# Patient Record
Sex: Male | Born: 1985 | Smoking: Current every day smoker
Health system: Southern US, Community
[De-identification: ages and names within clinical notes are randomized; demographics above are authoritative.]

---

## 2013-12-07 ENCOUNTER — Ambulatory Visit (INDEPENDENT_AMBULATORY_CARE_PROVIDER_SITE_OTHER): Payer: 59 | Admitting: Psychology

## 2013-12-07 DIAGNOSIS — F411 Generalized anxiety disorder: Secondary | ICD-10-CM

## 2013-12-21 ENCOUNTER — Ambulatory Visit: Payer: Self-pay | Admitting: Psychology

## 2014-01-04 ENCOUNTER — Ambulatory Visit: Payer: 59 | Admitting: Psychology

## 2014-07-12 ENCOUNTER — Encounter: Payer: Self-pay | Admitting: Internal Medicine

## 2014-09-03 ENCOUNTER — Ambulatory Visit: Payer: Self-pay | Admitting: Internal Medicine

## 2015-01-17 ENCOUNTER — Ambulatory Visit: Payer: Worker's Compensation

## 2015-01-17 ENCOUNTER — Ambulatory Visit (INDEPENDENT_AMBULATORY_CARE_PROVIDER_SITE_OTHER): Payer: Worker's Compensation | Admitting: Emergency Medicine

## 2015-01-17 VITALS — BP 138/98 | HR 67 | Temp 98.1°F | Resp 16 | Ht 71.0 in | Wt 220.0 lb

## 2015-01-17 DIAGNOSIS — M722 Plantar fascial fibromatosis: Secondary | ICD-10-CM

## 2015-01-17 MED ORDER — NAPROXEN SODIUM 550 MG PO TABS: 550.0000 mg | ORAL_TABLET | Freq: Two times a day (BID) | ORAL | Status: DC

## 2015-01-17 MED ORDER — HYDROCODONE-ACETAMINOPHEN 5-325 MG PO TABS: 1.0000 | ORAL_TABLET | ORAL | Status: DC | PRN

## 2015-01-17 NOTE — Patient Instructions (Signed)
Foot Sprain The muscles and cord like structures which attach muscle to bone (tendons) that surround the feet are made up of units. A foot sprain can occur at the weakest spot in any of these units. This condition is most often caused by injury to or overuse of the foot, as from playing contact sports, or aggravating a previous injury, or from poor conditioning, or obesity. SYMPTOMS  Pain with movement of the foot.  Tenderness and swelling at the injury site.  Loss of strength is present in moderate or severe sprains. THE THREE GRADES OR SEVERITY OF FOOT SPRAIN ARE:  Mild (Grade I): Slightly pulled muscle without tearing of muscle or tendon fibers or loss of strength.  Moderate (Grade II): Tearing of fibers in a muscle, tendon, or at the attachment to bone, with small decrease in strength.  Severe (Grade III): Rupture of the muscle-tendon-bone attachment, with separation of fibers. Severe sprain requires surgical repair. Often repeating (chronic) sprains are caused by overuse. Sudden (acute) sprains are caused by direct injury or over-use. DIAGNOSIS  Diagnosis of this condition is usually by your own observation. If problems continue, a caregiver may be required for further evaluation and treatment. X-rays may be required to make sure there are not breaks in the bones (fractures) present. Continued problems may require physical therapy for treatment. PREVENTION  Use strength and conditioning exercises appropriate for your sport.  Warm up properly prior to working out.  Use athletic shoes that are made for the sport you are participating in.  Allow adequate time for healing. Early return to activities makes repeat injury more likely, and can lead to an unstable arthritic foot that can result in prolonged disability. Mild sprains generally heal in 3 to 10 days, with moderate and severe sprains taking 2 to 10 weeks. Your caregiver can help you determine the proper time required for  healing. HOME CARE INSTRUCTIONS   Apply ice to the injury for 15-20 minutes, 03-04 times per day. Put the ice in a plastic bag and place a towel between the bag of ice and your skin.  An elastic wrap (like an Ace bandage) may be used to keep swelling down.  Keep foot above the level of the heart, or at least raised on a footstool, when swelling and pain are present.  Try to avoid use other than gentle range of motion while the foot is painful. Do not resume use until instructed by your caregiver. Then begin use gradually, not increasing use to the point of pain. If pain does develop, decrease use and continue the above measures, gradually increasing activities that do not cause discomfort, until you gradually achieve normal use.  Use crutches if and as instructed, and for the length of time instructed.  Keep injured foot and ankle wrapped between treatments.  Massage foot and ankle for comfort and to keep swelling down. Massage from the toes up towards the knee.  Only take over-the-counter or prescription medicines for pain, discomfort, or fever as directed by your caregiver. SEEK IMMEDIATE MEDICAL CARE IF:   Your pain and swelling increase, or pain is not controlled with medications.  You have loss of feeling in your foot or your foot turns cold or blue.  You develop new, unexplained symptoms, or an increase of the symptoms that brought you to your caregiver. MAKE SURE YOU:   Understand these instructions.  Will watch your condition.  Will get help right away if you are not doing well or get worse. Document Released:   01/08/2002 Document Revised: 10/11/2011 Document Reviewed: 03/07/2008 ExitCare Patient Information 2015 [employer]. This information is not intended to replace advice given to you by your health care provider. Make sure you discuss any questions you have with your health care provider.  

## 2015-01-17 NOTE — Progress Notes (Signed)
Subjective:  Patient ID: Allen Ochoa, male    DOB: 1986-01-09  Age: 29 y.o. MRN: [id number]  CC: Foot Injury   HPI Allen Ochoa presents   With an injury to his left foot. This working on a ladder and was stung repeatedly by a number of bees. He descended halfway down the ladder and about a 6 step he jumped and landed on his left foot. He has marked pain in his left arch of his foot with no swelling ecchymosis or deformity is unable to bear weight without significant pain. He has no improvement of the discomfort with Modic. He's not had any issues related to the insects stings just painful.  History Allen Ochoa has no past medical history on file.   He has no past surgical history on file.   His  family history includes Cancer in his maternal grandfather.  He   reports that he has been smoking.  He does not have any smokeless tobacco history on file. He reports that he drinks about 1.2 oz of alcohol per week. He reports that he does not use illicit drugs.  No outpatient prescriptions prior to visit.   No facility-administered medications prior to visit.    History   Social History  . Marital Status: Unknown    Spouse Name: N/A  . Number of Children: N/A  . Years of Education: N/A   Social History Main Topics  . Smoking status: Current Every Day Smoker  . Smokeless tobacco: Not on file  . Alcohol Use: 1.2 oz/week    2 Standard drinks or equivalent per week  . Drug Use: No  . Sexual Activity: Not on file   Other Topics Concern  . None   Social History Narrative  . None     Review of Systems  Constitutional: Negative for fever, chills and appetite change.  HENT: Negative for congestion, ear pain, postnasal drip, sinus pressure and sore throat.   Eyes: Negative for pain and redness.  Respiratory: Negative for cough, shortness of breath and wheezing.   Cardiovascular: Negative for leg swelling.  Gastrointestinal: Negative for nausea, vomiting, abdominal  pain, diarrhea, constipation and blood in stool.  Endocrine: Negative for polyuria.  Genitourinary: Negative for dysuria, urgency, frequency and flank pain.  Musculoskeletal: Negative for gait problem.  Skin: Negative for rash.  Neurological: Negative for weakness and headaches.  Psychiatric/Behavioral: Negative for confusion and decreased concentration. The patient is not nervous/anxious.     Objective:  BP 138/98 mmHg  Pulse 67  Temp(Src) 98.1 F (36.7 C) (Oral)  Resp 16  Ht  (1.803 m)  Wt 220 lb (99.791 kg)  BMI 30.70 kg/m2  SpO2 98%  Physical Exam  Musculoskeletal:       Left foot: There is tenderness. There is no swelling and no deformity.      Assessment & Plan:   Allen Ochoa was seen today for foot injury.  Diagnoses and all orders for this visit:  Plantar fasciitis of left foot Orders: -     Ambulatory referral to Orthopedic Surgery -     DG Foot Complete Left; Future  Other orders -     naproxen sodium (ANAPROX DS) 550 MG tablet; Take 1 tablet (550 mg total) by mouth 2 (two) times daily with a meal. -     HYDROcodone-acetaminophen (NORCO) 5-325 MG per tablet; Take 1-2 tablets by mouth every 4 (four) hours as needed.   I am having Allen Ochoa start on naproxen sodium and HYDROcodone-acetaminophen. I  am also having him maintain his meloxicam.  Meds ordered this encounter  Medications  . meloxicam (MOBIC) 15 MG tablet    Sig: Take 15 mg by mouth daily.  . naproxen sodium (ANAPROX DS) 550 MG tablet    Sig: Take 1 tablet (550 mg total) by mouth 2 (two) times daily with a meal.    Dispense:  40 tablet    Refill:  0  . HYDROcodone-acetaminophen (NORCO) 5-325 MG per tablet    Sig: Take 1-2 tablets by mouth every 4 (four) hours as needed.    Dispense:  30 tablet    Refill:  0    Appropriate red flag conditions were discussed with the patient as well as actions that should be taken.  Patient expressed his understanding.  Follow-up: No Follow-up on  file.  Carmelina Dane, MD   [facility] reading (PRIMARY) by  Dr. Dareen Piano.  negative.

## 2015-01-24 ENCOUNTER — Ambulatory Visit (INDEPENDENT_AMBULATORY_CARE_PROVIDER_SITE_OTHER): Payer: Worker's Compensation | Admitting: Emergency Medicine

## 2015-01-24 VITALS — BP 120/68 | HR 71 | Temp 98.4°F | Resp 17 | Ht 72.0 in | Wt 215.0 lb

## 2015-01-24 DIAGNOSIS — M722 Plantar fascial fibromatosis: Secondary | ICD-10-CM | POA: Diagnosis not present

## 2015-01-24 NOTE — Patient Instructions (Signed)
Plantar Fasciitis  Plantar fasciitis is a common condition that causes foot pain. It is soreness (inflammation) of the band of tough fibrous tissue on the bottom of the foot that runs from the heel bone (calcaneus) to the ball of the foot. The cause of this soreness may be from excessive standing, poor fitting shoes, running on hard surfaces, being overweight, having an abnormal walk, or overuse (this is common in runners) of the painful foot or feet. It is also common in aerobic exercise dancers and ballet dancers.  SYMPTOMS   Most people with plantar fasciitis complain of:   Severe pain in the morning on the bottom of their foot especially when taking the first steps out of bed. This pain recedes after a few minutes of walking.   Severe pain is experienced also during walking following a long period of inactivity.   Pain is worse when walking barefoot or up stairs  DIAGNOSIS    Your caregiver will diagnose this condition by examining and feeling your foot.   Special tests such as X-rays of your foot, are usually not needed.  PREVENTION    Consult a sports medicine professional before beginning a new exercise program.   Walking programs offer a good workout. With walking there is a lower chance of overuse injuries common to runners. There is less impact and less jarring of the joints.   Begin all new exercise programs slowly. If problems or pain develop, decrease the amount of time or distance until you are at a comfortable level.   Wear good shoes and replace them regularly.   Stretch your foot and the heel cords at the back of the ankle (Achilles tendon) both before and after exercise.   Run or exercise on even surfaces that are not hard. For example, asphalt is better than pavement.   Do not run barefoot on hard surfaces.   If using a treadmill, vary the incline.   Do not continue to workout if you have foot or joint problems. Seek professional help if they do not improve.  HOME CARE INSTRUCTIONS     Avoid activities that cause you pain until you recover.   Use ice or cold packs on the problem or painful areas after working out.   Only take over-the-counter or prescription medicines for pain, discomfort, or fever as directed by your caregiver.   Soft shoe inserts or athletic shoes with air or gel sole cushions may be helpful.   If problems continue or become more severe, consult a sports medicine caregiver or your own health care provider. Cortisone is a potent anti-inflammatory medication that may be injected into the painful area. You can discuss this treatment with your caregiver.  MAKE SURE YOU:    Understand these instructions.   Will watch your condition.   Will get help right away if you are not doing well or get worse.  Document Released: 04/13/2001 Document Revised: 10/11/2011 Document Reviewed: 06/12/2008  ExitCare Patient Information 2015 [employer]. This information is not intended to replace advice given to you by your health care provider. Make sure you discuss any questions you have with your health care provider.

## 2015-01-24 NOTE — Progress Notes (Signed)
Allen Ochoa 1985/11/26 29 y.o.   Chief Complaint  Patient presents with  . Follow-up    Left foot     Date of Injury: 01/17/15  History of Present Illness:  Presents for evaluation of work-related complaint.   Review of Systems  Constitutional: Negative.   Respiratory: Negative.   Skin: Negative for rash.  Neurological: Negative.     Review of systems was unremarkable   No Known Allergies   Current medications reviewed and updated. Past medical history, family history, social history have been reviewed and updated.   Physical Exam  Constitutional: He is oriented to person, place, and time. Vital signs are normal. He appears healthy. He appears distressed.  HENT:  Head: Normocephalic and atraumatic.  Eyes: Pupils are equal, round, and reactive to light.  Neck: Normal range of motion. Neck supple.  Pulmonary/Chest: Effort normal. No respiratory distress.  Abdominal: Soft.  Musculoskeletal:       Left foot: There is tenderness. There is no swelling and no crepitus.  Neurological: He is alert and oriented to person, place, and time.  Skin: Skin is warm and dry.     Assessment and Plan:   Plantar fasciitis.  He has an appointment with orthopedic surgeon next Thursday. He is to continue his medication both a Anaprox Norco as needed using a a boot for ambulation. Continue work restrictions with the understanding if he is able to return to working On Monday He May Do so.Marland Kitchen

## 2015-08-22 ENCOUNTER — Ambulatory Visit (INDEPENDENT_AMBULATORY_CARE_PROVIDER_SITE_OTHER): Payer: Worker's Compensation | Admitting: Family Medicine

## 2015-08-22 ENCOUNTER — Ambulatory Visit: Payer: Worker's Compensation

## 2015-08-22 VITALS — BP 122/74 | HR 87 | Temp 98.3°F | Resp 17 | Ht 71.5 in | Wt 212.0 lb

## 2015-08-22 DIAGNOSIS — S99922A Unspecified injury of left foot, initial encounter: Secondary | ICD-10-CM | POA: Diagnosis not present

## 2015-08-22 MED ORDER — NAPROXEN 500 MG PO TABS: 500.0000 mg | ORAL_TABLET | Freq: Two times a day (BID) | ORAL | Status: AC

## 2015-08-22 NOTE — Patient Instructions (Signed)
Ice for 20-30 minutes every 2-3 hours, elevated and off.  Use crutches until you can walk normally without limping.  Recommend wearing the boot until you can use shoes without difficulty.  Elevate and off it over the weekend.  Hopefully on Monday you can go back to work wearing the boot. Recheck on Monday with the boot.  SYMPTOMS   General pain on the bottom of the heel.  Pain that gets worse when running on hard surfaces or in a shoe with poor shock absorbers.  No swelling or increased warmth.  Less cushion on bottom of the heel.   PREVENTION   Warm up and stretch properly before activity.  Maintain physical fitness:  Strength, flexibility and endurance.  Cardiovascular fitness.  Maintain a healthy body weight.  Avoid activities that place constant or repeated strain on the foot.  Wear properly fitted and padded shoes.  Change shoes every 300 to 500 miles.  When possible, run on soft surfaces.  Wear a heel lift to reduce pressure to the heel (pushing weight to the front of the foot).  Emphasize cross training. PROGNOSIS  If treated properly, heel compression syndrome may be cured. However, sometimes heel compression syndrome results in a chronic condition. RELATED COMPLICATIONS  Frequently recurring symptoms, resulting in a chronic problem that often affects your ability to compete.  TREATMENT  Treatment first involves ice and medicine to reduce pain and inflammation. It is important to perform exercises that stretch the heel cord, and to modify activities that aggravate symptoms. These exercises may be performed at home or with a therapist. You may find changing shoes or using a heel insert helpful in reducing pain and discomfort. There are no surgical procedures that exist to treat this condition.  MEDICATION  If pain medicine is needed, nonsteroidal anti-inflammatory medicines (aspirin and ibuprofen), or other minor pain relievers (acetaminophen), are often  advised  Prescription pain relievers may be given if your caregiver thinks they are needed. Use only as directed and only as much as you need. HEAT AND COLD  Cold treatment (icing) relieves pain and reduces inflammation. Cold treatment should be applied for 10 to 15 minutes every 2 to 3 hours, and immediately after activity that aggravates your symptoms. Use ice packs or an ice massage.  Heat treatment may be used before performing stretching and strengthening activities prescribed by your caregiver, physical therapist, or athletic trainer. Use a heat pack or a warm water soak. SEEK MEDICAL CARE IF:  Symptoms get worse or do not improve in 2 weeks, despite treatment. EXERCISES  STRETCHING EXERCISES - Heel Compression Syndrome (Fat Pad Atrophy) These exercises may help you when beginning to rehabilitate your injury. Your symptoms may go away with or without further involvement from your physician, physical therapist or athletic trainer. While completing these exercises, remember:   Restoring tissue flexibility helps normal motion to return to the joints. This allows healthier, less painful movement and activity.  An effective stretch should be held for at least 30 seconds.  A stretch should never be painful. You should only feel a gentle lengthening or release in the stretched tissue. STRETCH - Gastroc, Standing   Place your hands on a wall.  Extend your right / left leg behind you, keeping the front knee somewhat bent.  Slightly point your toes inward on your back foot.  Keeping your right / left heel on the floor and your knee straight, shift your weight toward the wall, not allowing your back to arch.  You  should feel a gentle stretch in the right / left calf. Hold this position for __________ seconds. Repeat __________ times. Complete this stretch __________ times per day. STRETCH - Soleus, Standing   Place your hands on a wall.  Extend your right / left leg behind you, keeping  the other knee somewhat bent.  Slightly point your toes inward on your back foot.  Keep your right / left heel on the floor, bend your back knee, and slightly shift your weight over the back leg, so that you feel a gentle stretch deep in your back calf.  Hold this position for __________ seconds. Repeat __________ times. Complete this stretch __________ times per day. STRETCH - Gastrocsoleus, Standing  Note: This exercise can place a lot of stress on your foot and ankle. Please complete this exercise only if specifically instructed by your caregiver.  Place the ball of your right / left foot on a step, keeping your other foot firmly on the same step.  Hold on to the wall or a rail for balance.  Slowly lift your other foot, allowing your body weight to press your heel down over the edge of the step.  You should feel a stretch in your right / left calf.  Hold this position for __________ seconds.  Repeat this exercise with a slight bend in your misc216 knee. Repeat __________ times. Complete this stretch __________ times per day.    This information is not intended to replace advice given to you by your health care provider. Make sure you discuss any questions you have with your health care provider.   Document Released: 07/19/2005 Document Revised: 08/09/2014 Document Reviewed: 10/31/2008 Elsevier Interactive Patient Education [employer]

## 2015-08-22 NOTE — Progress Notes (Signed)
Subjective:  By signing my name below, I, Rawaa Al Rifaie, attest that this documentation has been prepared under the direction and in the presence of Norberto Sorenson, MD.  Allen Ochoa, Medical Scribe. 08/22/2015.  1:52 PM.    Patient ID: Allen Ochoa, male    DOB: 04/03/1986, 30 y.o.   MRN: [id number]  Chief Complaint  Patient presents with  . Ankle Injury    left side   . Depression    HPI HPI Comments: Allen Ochoa is a 30 y.o. male who presents to [facility] complaining of a left ankle injury that occurred this morning. He notes that as he was on a small hill carrying a ladder when he slipped and fell. Pt reports the pain to be over his heel area. He indicates that he has a history of plantar fasciitis that he usually takes tramadol for, which he took for his current symptoms. Pt also took Ibuprofen. He notes that he already has crutches and short boots at home from his last fall in June.    Prior to Admission medications   Medication Sig Start Date End Date Taking? Authorizing Provider  traMADol (ULTRAM) 50 MG tablet Take by mouth every 6 (six) hours as needed.   Yes Historical Provider, MD   No Known Allergies   Review of Systems  Constitutional: Positive for activity change. Negative for fever and appetite change.  Musculoskeletal: Positive for arthralgias and gait problem. Negative for myalgias, back pain and joint swelling.  Skin: Negative for color change, rash and wound.  Neurological: Negative for weakness and numbness.  Hematological: Does not bruise/bleed easily.  Psychiatric/Behavioral: Negative for sleep disturbance.      Objective:   Physical Exam  Constitutional: He is oriented to person, place, and time. He appears well-developed and well-nourished. No distress.  HENT:  Head: Normocephalic and atraumatic.  Eyes: EOM are normal. Pupils are equal, round, and reactive to light.  Neck: Neck supple.  Cardiovascular: Normal rate.     Pulses:      Dorsalis pedis pulses are 2+ on the right side, and 2+ on the left side.       Posterior tibial pulses are 2+ on the right side, and 2+ on the left side.  Pulmonary/Chest: Effort normal.  Musculoskeletal:  No tenderness over the medial later malleolus, or cf ligament. No significant tenderness over the metatarsal. Moderate restriction and ROM. Achillis intact. Negative squeeze test.   Neurological: He is alert and oriented to person, place, and time. No cranial nerve deficit.  Skin: Skin is warm and dry.  Psychiatric: He has a normal mood and affect. His behavior is normal.  Nursing note and vitals reviewed.   BP 122/74 mmHg  Pulse 87  Temp(Src) 98.3 F (36.8 C) (Oral)  Resp 17  Ht 5' 11.5" (1.816 m)  Wt 212 lb (96.163 kg)  BMI 29.16 kg/m2  SpO2 98%   [facility] (PRIMARY) x-ray report read by Dr. Norberto Sorenson, MD: left foot- normal.      Assessment & Plan:   1. Foot injury, left, initial encounter   RICE - use crutches until can walk w/o limp. Cont boot. Recheck in 3d.  Orders Placed This Encounter  Procedures  . DG Foot Complete Left    Standing Status: Future     Number of Occurrences: 1     Standing Expiration Date: 08/21/2016    Order Specific Question:  Reason for Exam (SYMPTOM  OR DIAGNOSIS REQUIRED)  Answer:  FELL OF LADDER. PAIN OVER HEAL    Order Specific Question:  Preferred imaging location?    Answer:  External    Meds ordered this encounter  Medications  . traMADol (ULTRAM) 50 MG tablet    Sig: Take by mouth every 6 (six) hours as needed.  . naproxen (NAPROSYN) 500 MG tablet    Sig: Take 1 tablet (500 mg total) by mouth 2 (two) times daily with a meal.    Dispense:  30 tablet    Refill:  0    I personally performed the services described in this documentation, which was scribed in my presence. The recorded information has been reviewed and considered, and addended by me as needed.  Norberto Sorenson, MD MPH

## 2015-12-19 IMAGING — CR DG FOOT COMPLETE 3+V*L*
3 series · 3 of 3 positions shown · non-contrast
Comparison: None.

CLINICAL DATA: Foot pain.

EXAM:
LEFT FOOT - COMPLETE 3+ VIEW

[AP]
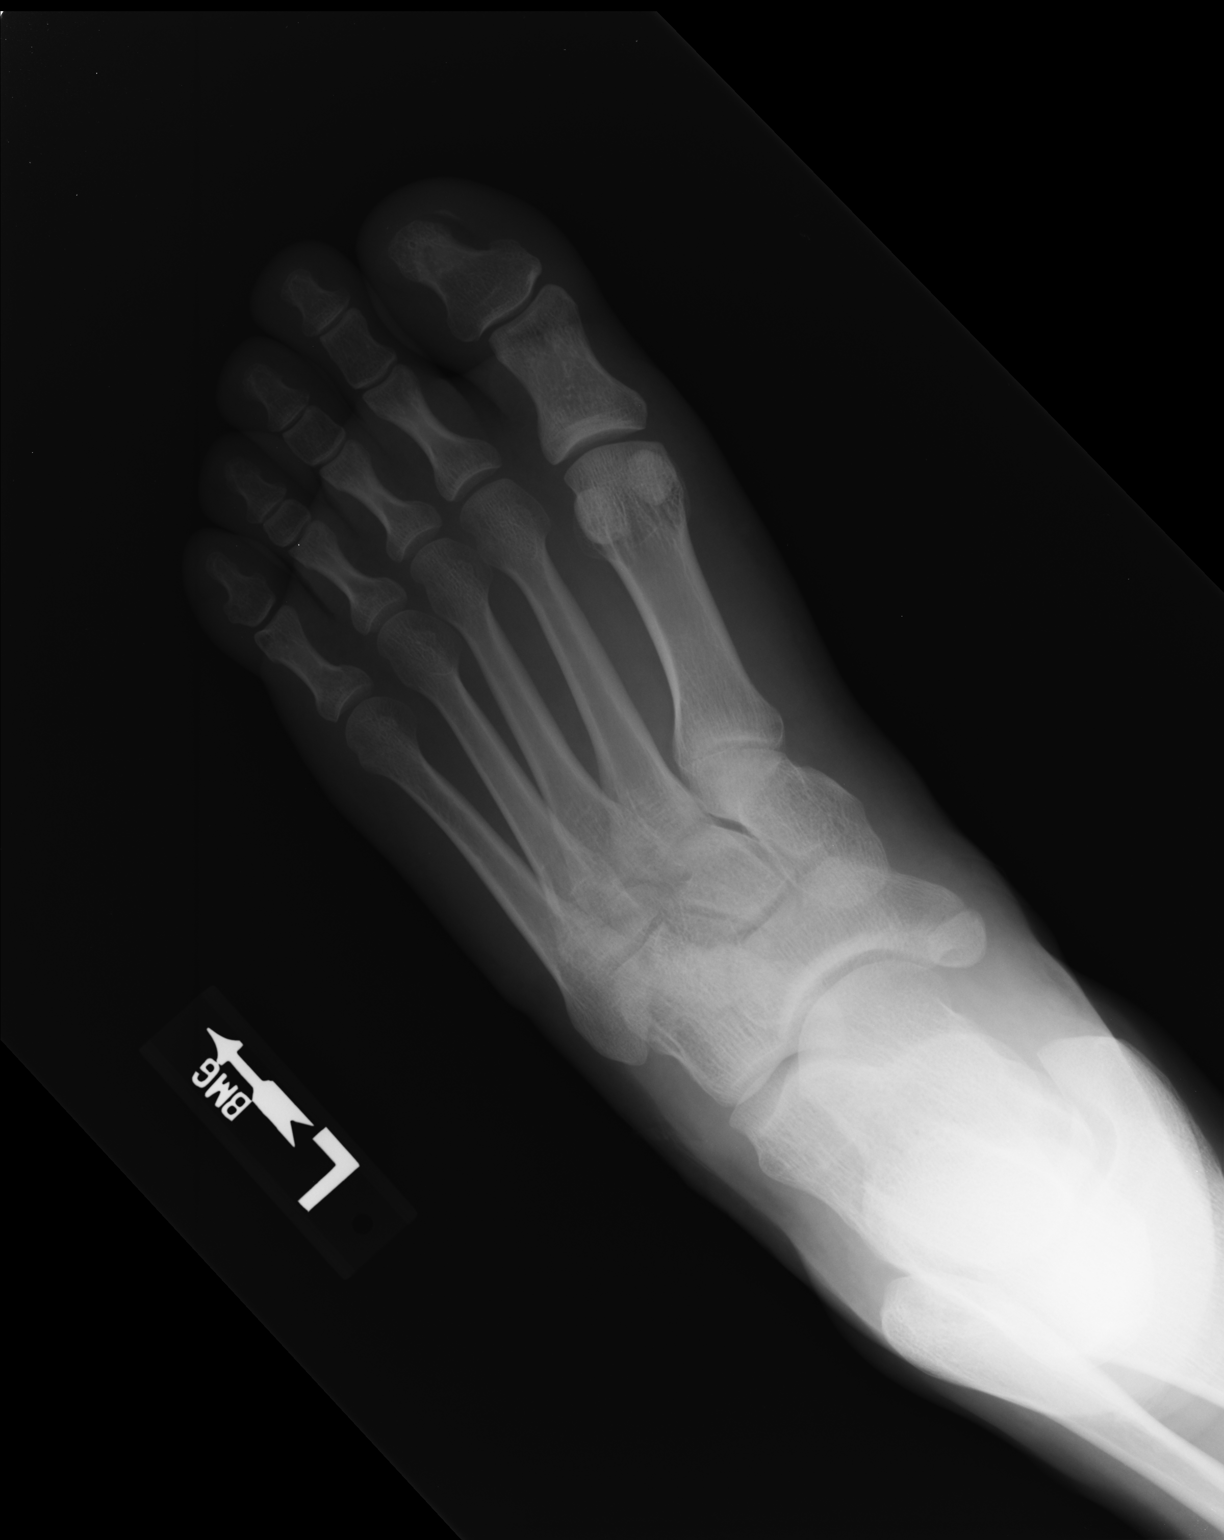

[ap obl int rot]
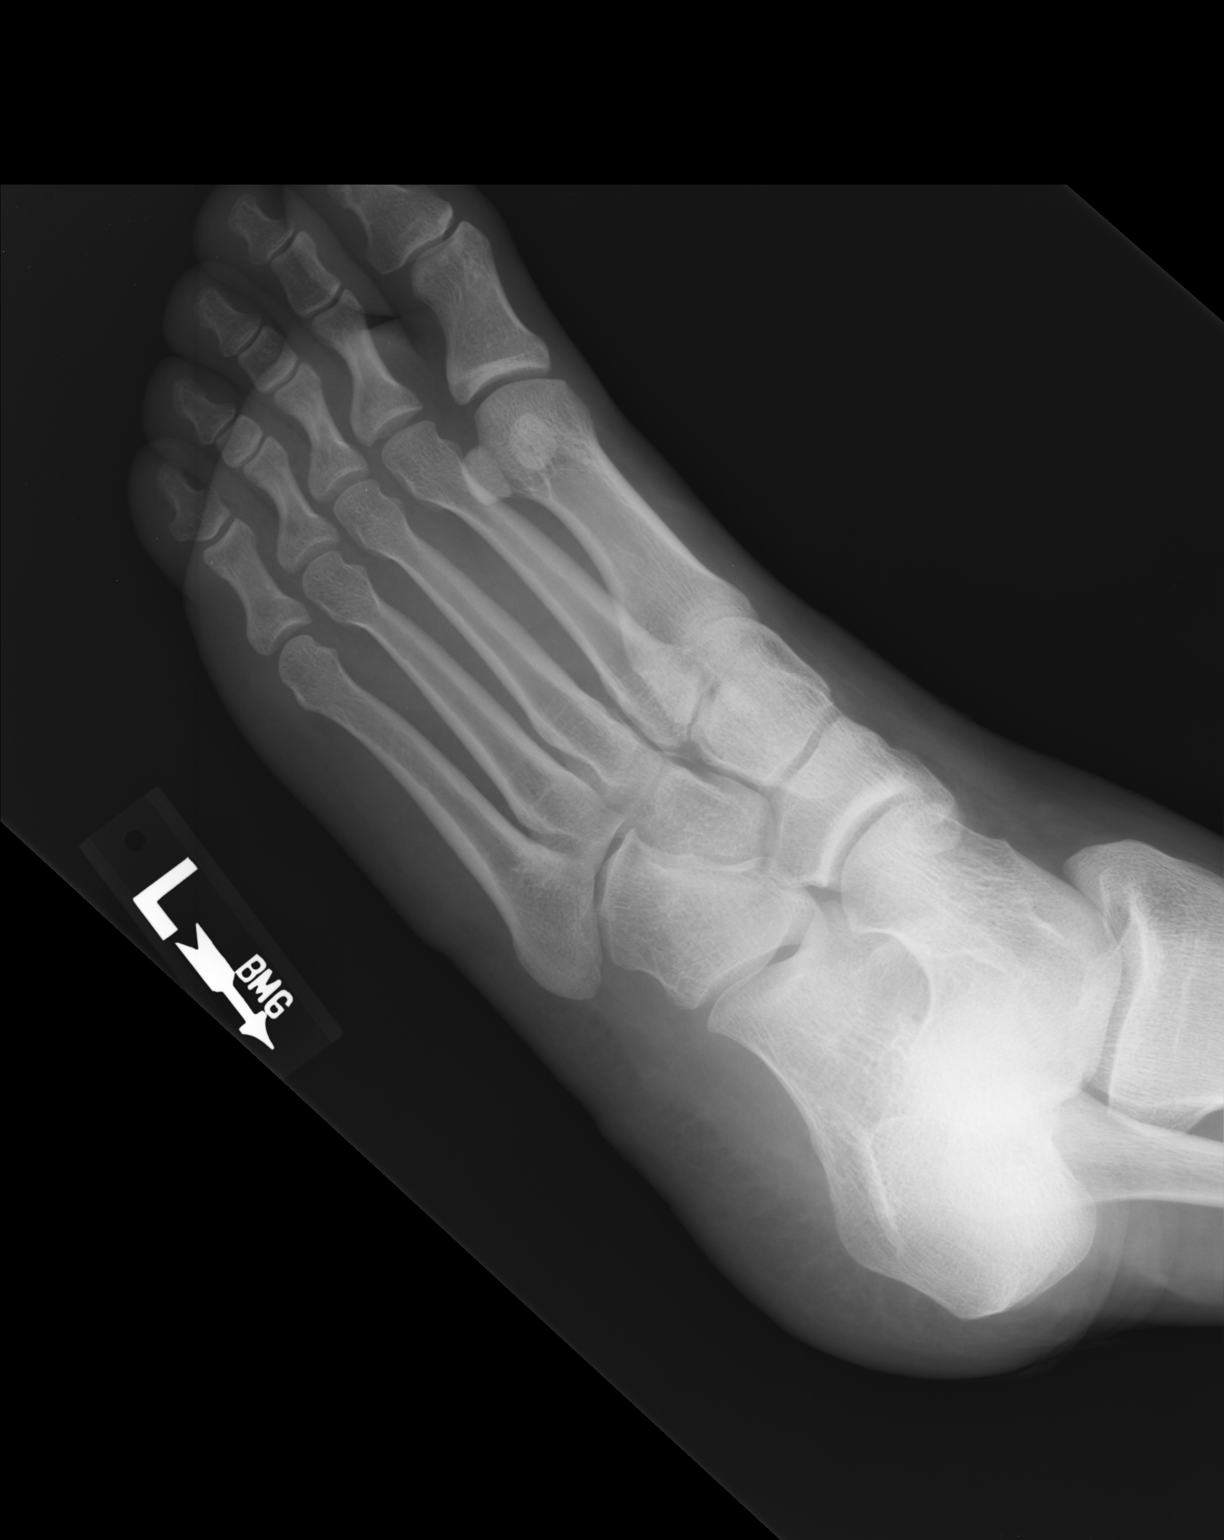

[lateral]
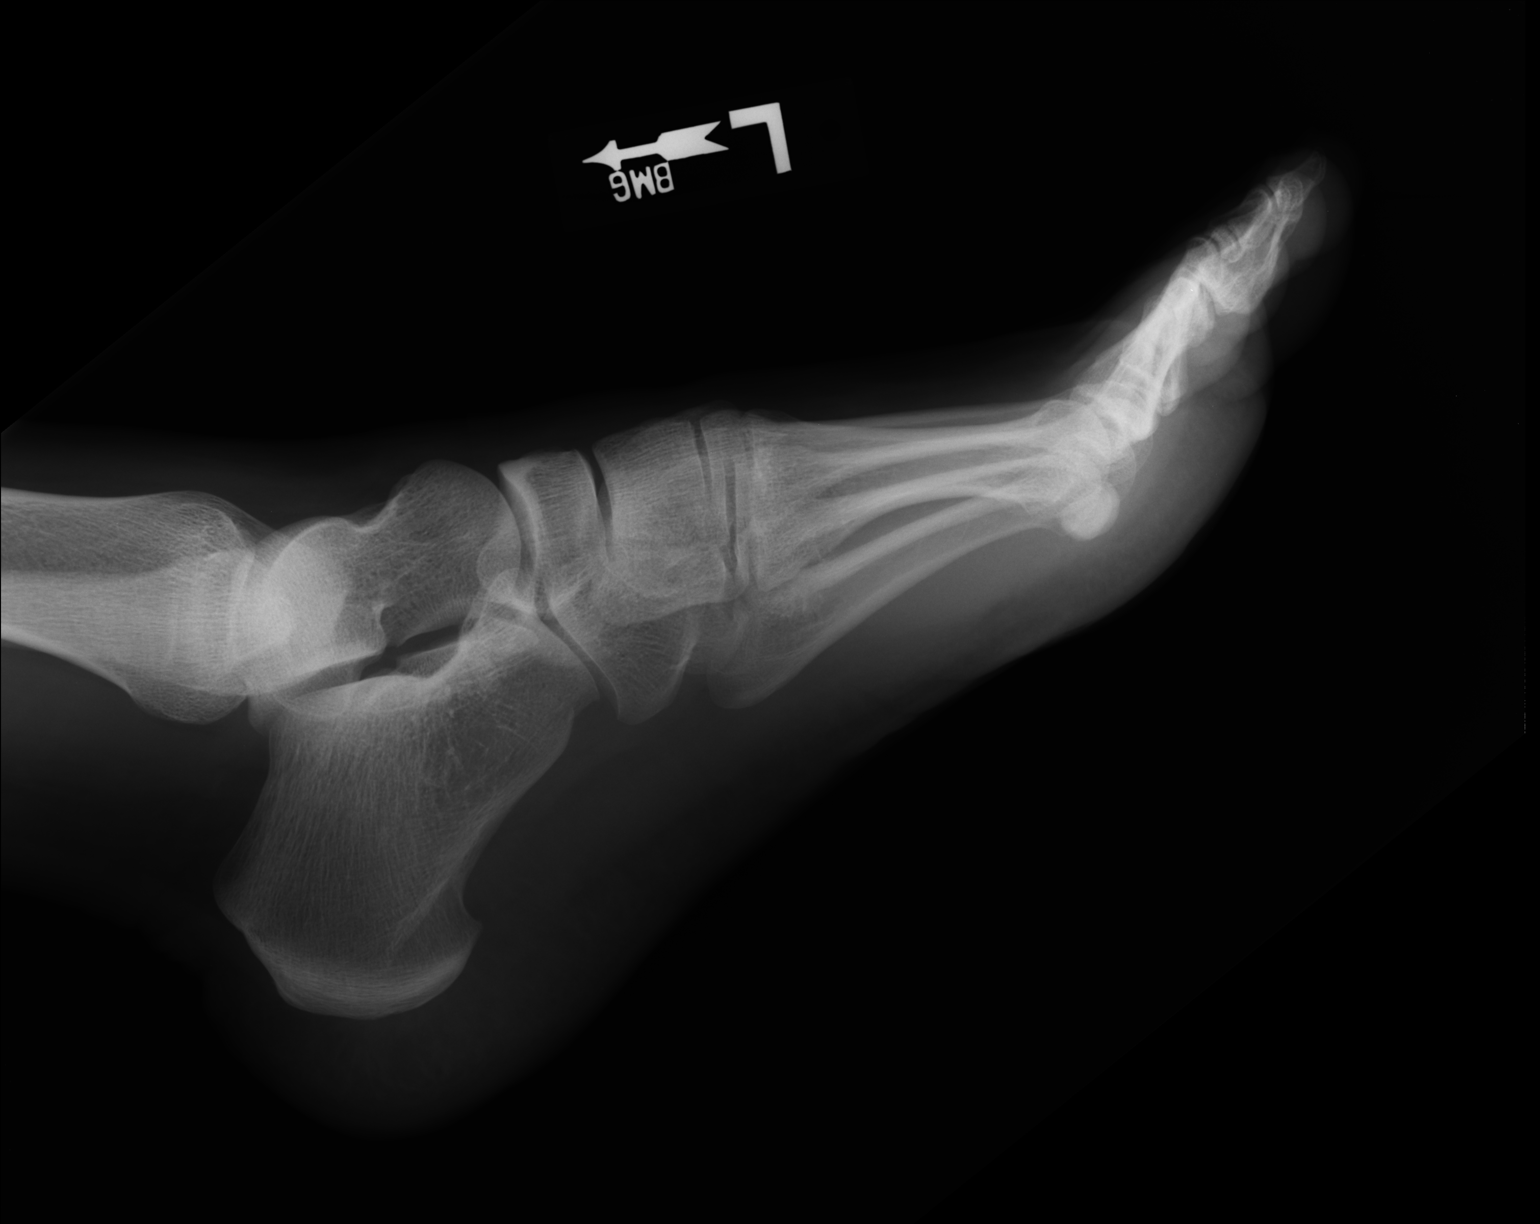

[3 of 3 positions shown; findings below may reference images not displayed]

FINDINGS: No acute bony or joint abnormality identified. No evidence of
fracture or dislocation. No acute abnormality identified.
IMPRESSION: No acute abnormality.

## 2016-07-23 IMAGING — CR DG FOOT COMPLETE 3+V*L*
3 series · 3 of 3 positions shown · non-contrast
Comparison: 01/17/2015

CLINICAL DATA: Fall from ladder with pain over the left heel.
Initial encounter.

EXAM:
LEFT FOOT - COMPLETE 3+ VIEW

[AP]
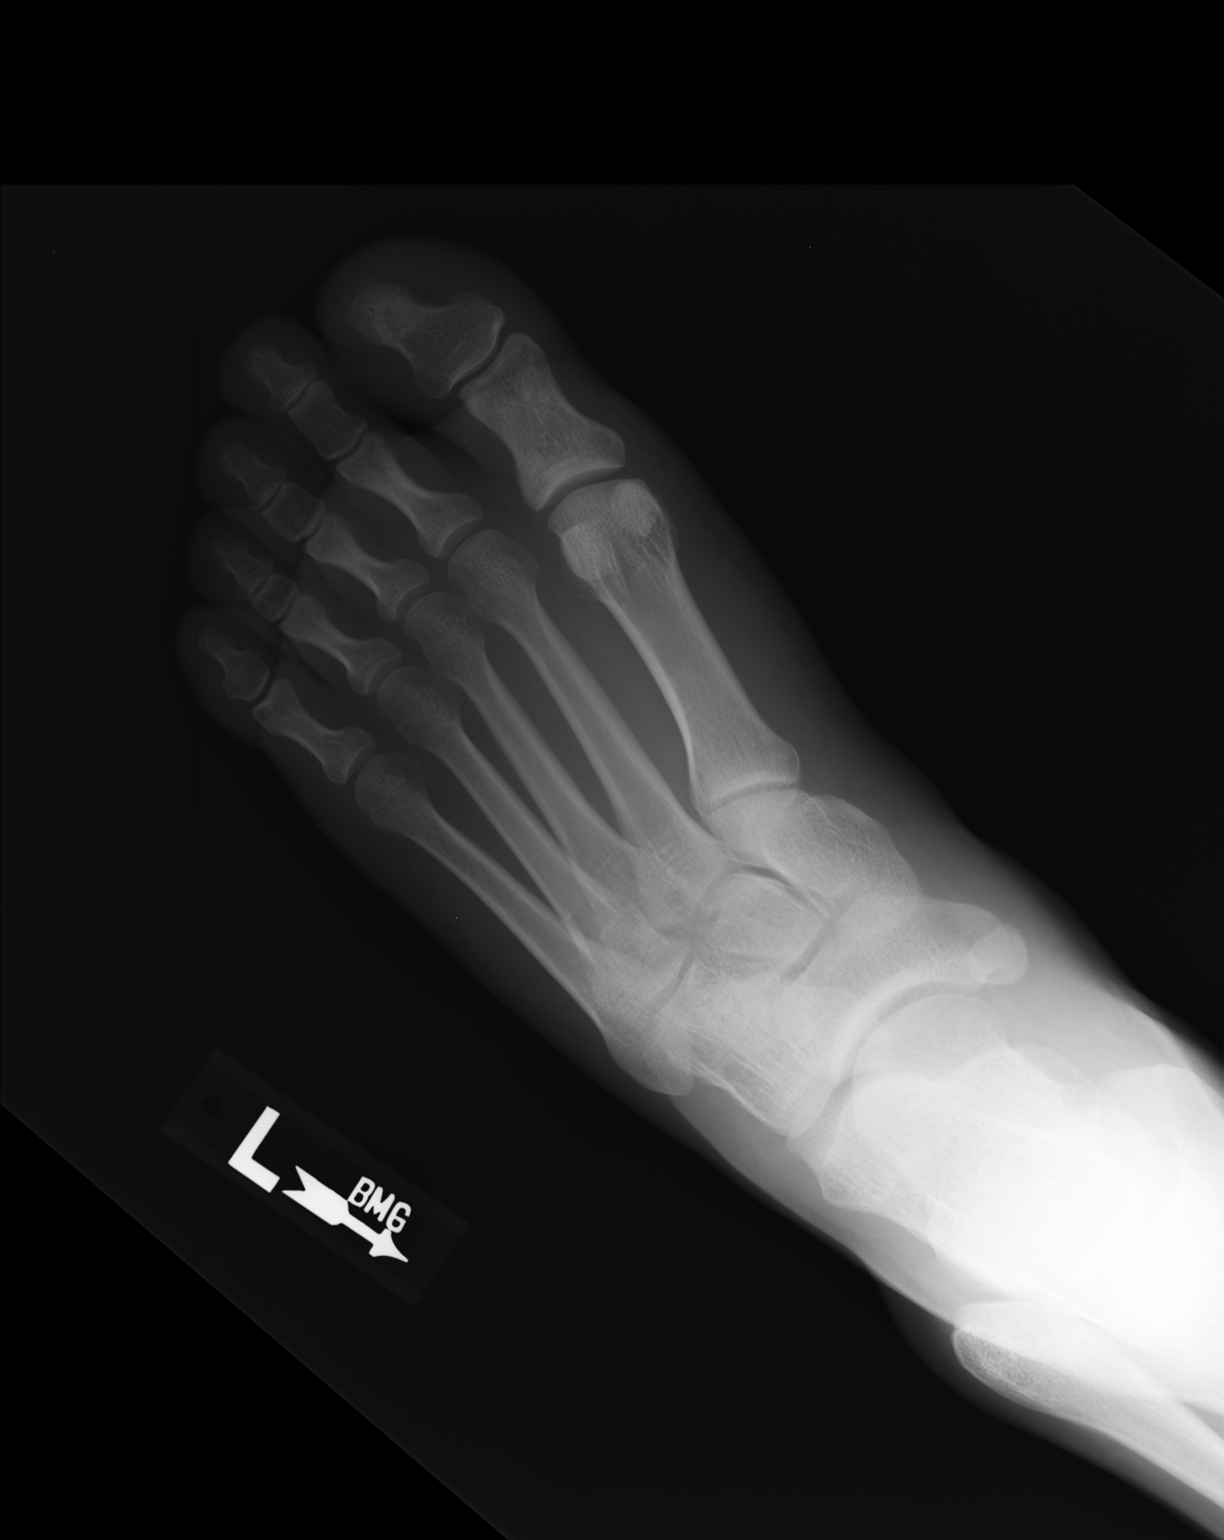

[ap obl int rot]
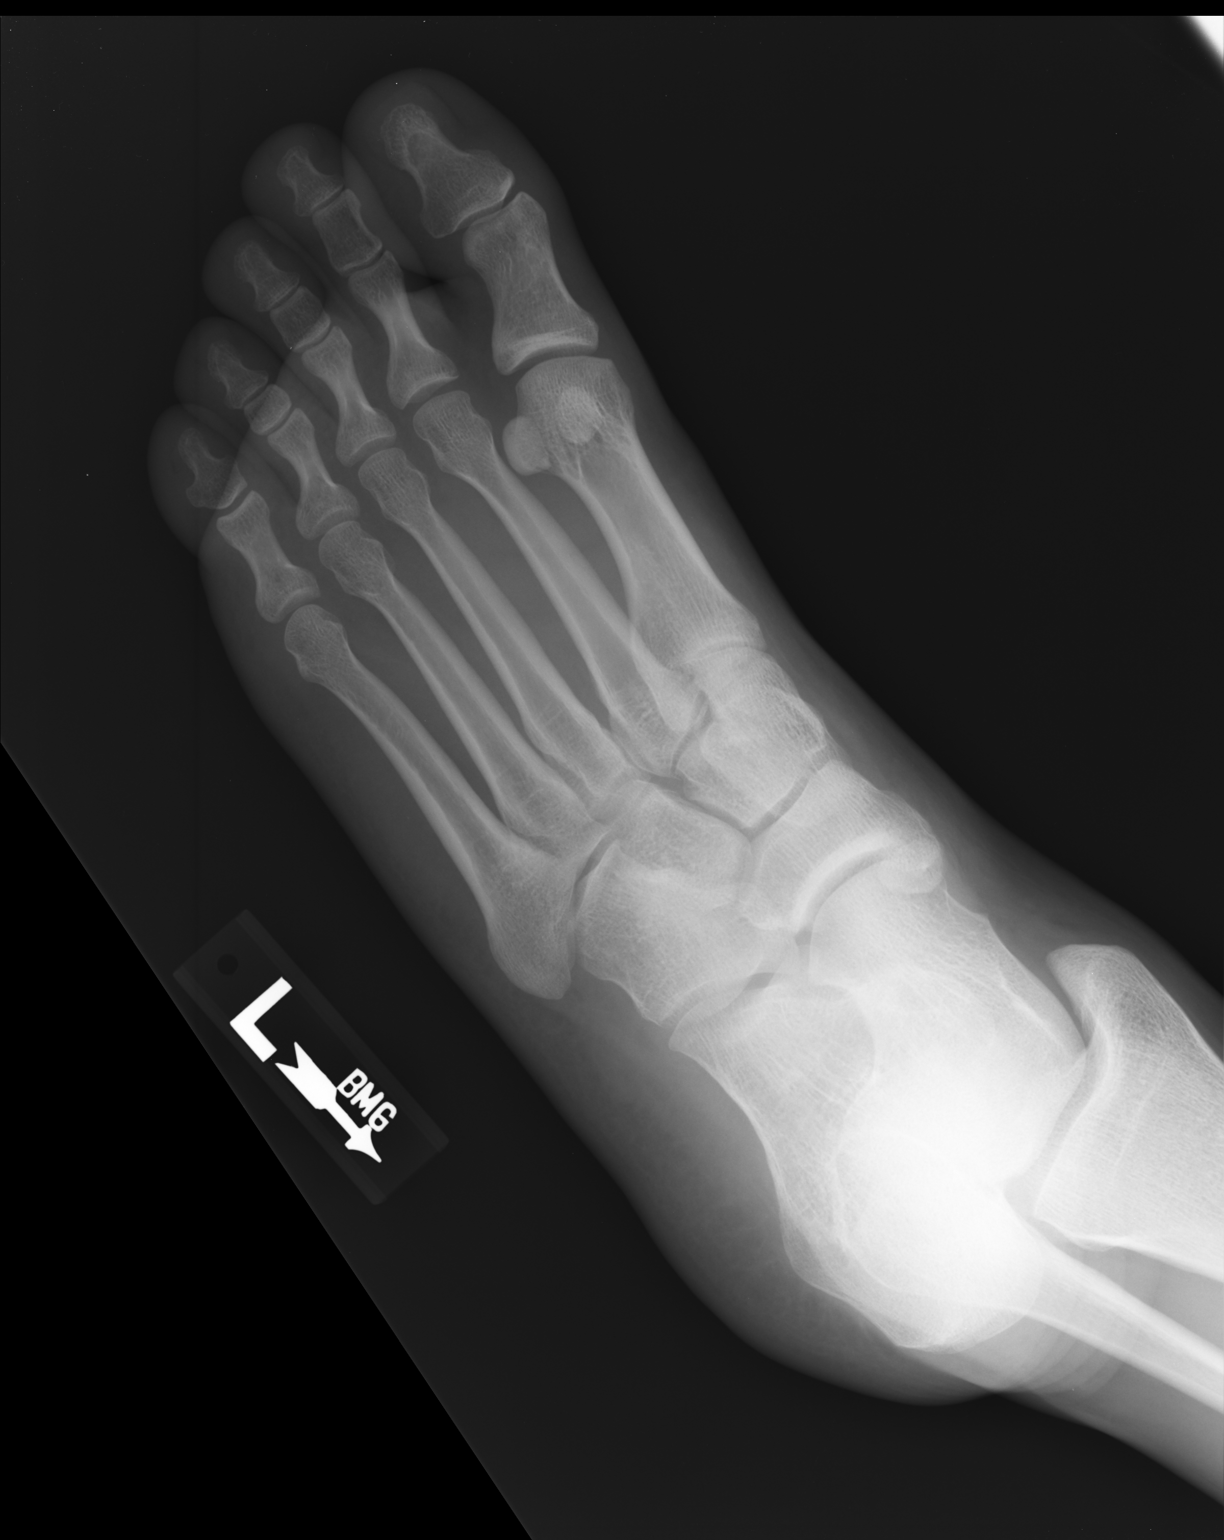

[lateral]
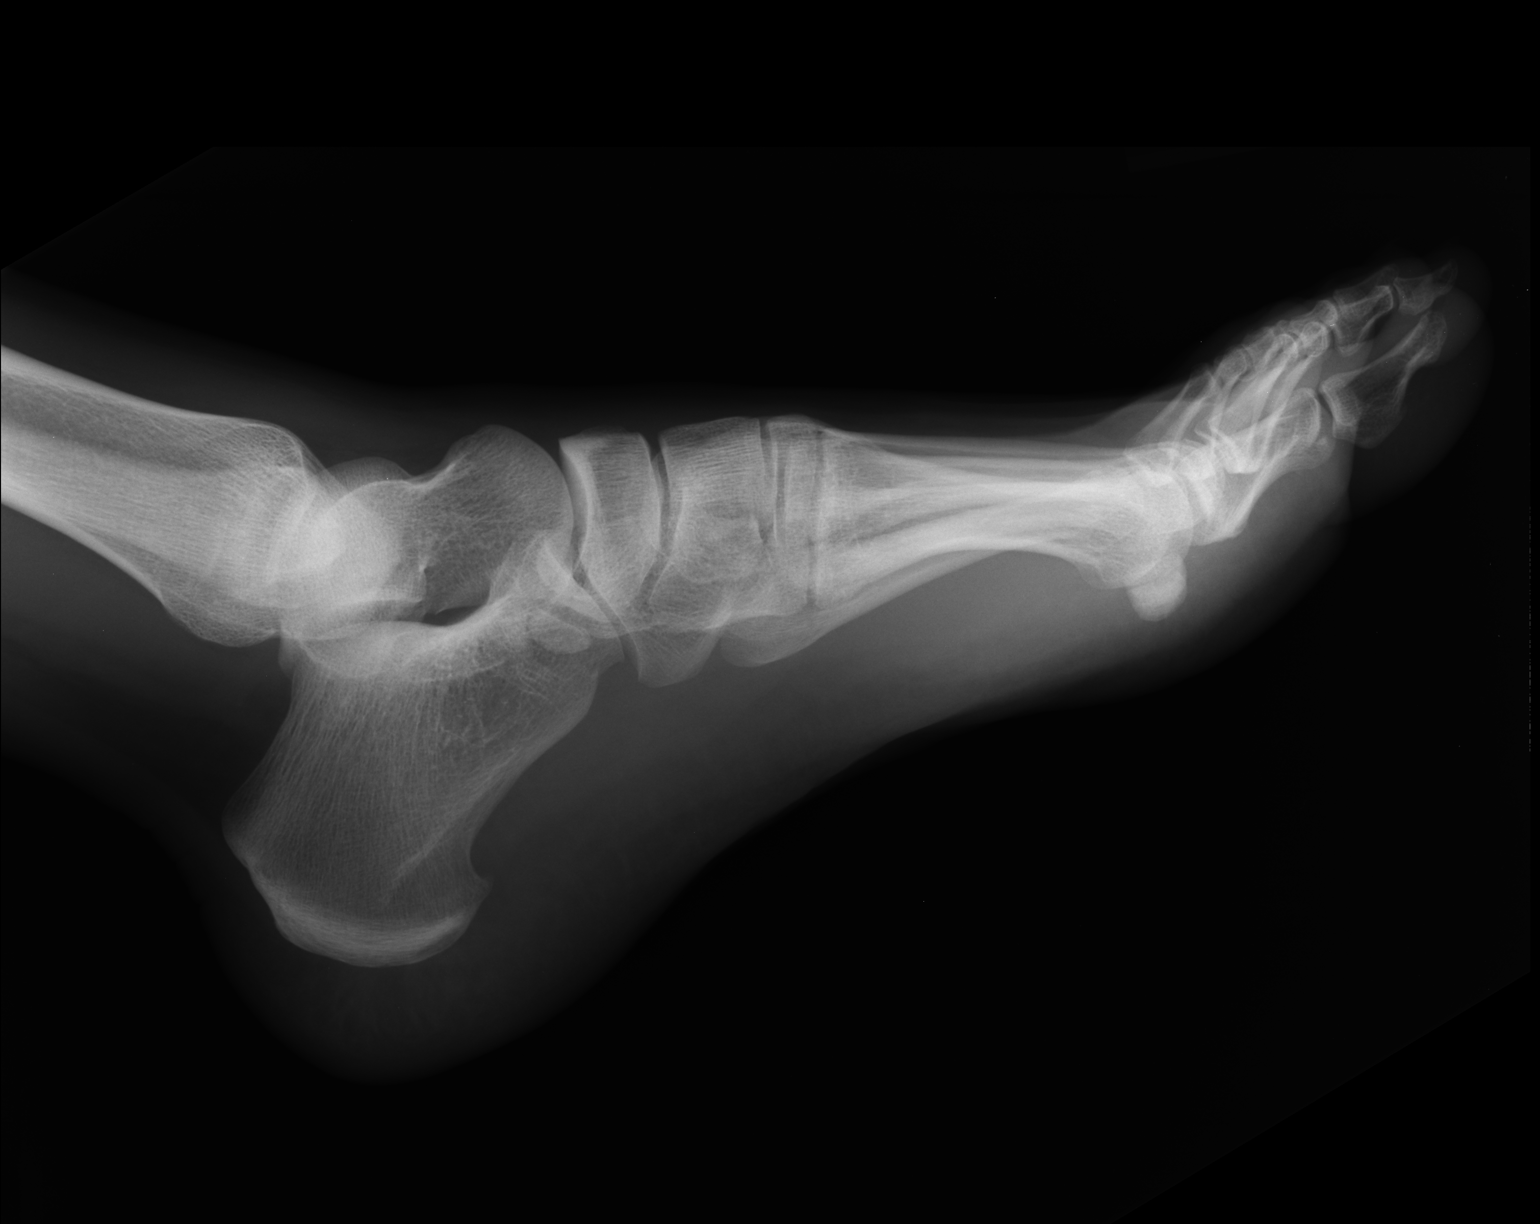

[3 of 3 positions shown; findings below may reference images not displayed]

FINDINGS: There is no evidence of fracture or dislocation. There is no
evidence of arthropathy or other focal bone abnormality. Soft
tissues are unremarkable.
IMPRESSION: Negative.
